# Patient Record
Sex: Female | Born: 1965 | Race: White | Hispanic: No | Marital: Married | Smoking: Current every day smoker
Health system: Southern US, Community
[De-identification: ages and names within clinical notes are randomized; demographics above are authoritative.]

## PROBLEM LIST (undated history)

## (undated) DIAGNOSIS — D649 Anemia, unspecified: Secondary | ICD-10-CM

## (undated) HISTORY — DX: Anemia, unspecified: D64.9

---

## 1989-05-03 HISTORY — PX: TUBAL LIGATION: SHX77

## 2002-10-18 ENCOUNTER — Encounter: Payer: Self-pay | Admitting: Emergency Medicine

## 2002-10-18 ENCOUNTER — Emergency Department (HOSPITAL_COMMUNITY): Admission: EM | Admit: 2002-10-18 | Discharge: 2002-10-18 | Payer: Self-pay | Admitting: Emergency Medicine

## 2008-02-03 ENCOUNTER — Emergency Department (HOSPITAL_COMMUNITY): Admission: EM | Admit: 2008-02-03 | Discharge: 2008-02-04 | Payer: Self-pay | Admitting: Emergency Medicine

## 2009-08-25 ENCOUNTER — Ambulatory Visit (HOSPITAL_COMMUNITY): Admission: RE | Admit: 2009-08-25 | Discharge: 2009-08-25 | Payer: Self-pay | Admitting: Family Medicine

## 2010-05-24 ENCOUNTER — Encounter: Payer: Self-pay | Admitting: Neurosurgery

## 2011-02-02 LAB — CBC
MCHC: 35.2
Platelets: 338
RBC: 4.13
WBC: 13.1 — ABNORMAL HIGH

## 2011-02-02 LAB — URINE MICROSCOPIC-ADD ON

## 2011-02-02 LAB — DIFFERENTIAL
Basophils Relative: 1
Lymphs Abs: 1.7
Monocytes Relative: 5
Neutro Abs: 10.7 — ABNORMAL HIGH
Neutrophils Relative %: 82 — ABNORMAL HIGH

## 2011-02-02 LAB — URINALYSIS, ROUTINE W REFLEX MICROSCOPIC
Bilirubin Urine: NEGATIVE
Glucose, UA: 250 — AB
Ketones, ur: 15 — AB
Specific Gravity, Urine: >1.03 — ABNORMAL HIGH
pH: 6.5

## 2016-11-24 ENCOUNTER — Other Ambulatory Visit (HOSPITAL_COMMUNITY)
Admission: RE | Admit: 2016-11-24 | Discharge: 2016-11-24 | Disposition: A | Discharge status: Home / Self Care | Payer: BLUE CROSS/BLUE SHIELD | Source: Ambulatory Visit | Attending: Obstetrics and Gynecology | Admitting: Obstetrics and Gynecology

## 2016-11-24 ENCOUNTER — Ambulatory Visit (INDEPENDENT_AMBULATORY_CARE_PROVIDER_SITE_OTHER): Payer: BLUE CROSS/BLUE SHIELD | Admitting: Obstetrics and Gynecology

## 2016-11-24 ENCOUNTER — Encounter: Payer: Self-pay | Admitting: Obstetrics and Gynecology

## 2016-11-24 VITALS — BP 90/52 | HR 83 | Ht 63.5 in | Wt 128.8 lb

## 2016-11-24 DIAGNOSIS — Z01419 Encounter for gynecological examination (general) (routine) without abnormal findings: Secondary | ICD-10-CM | POA: Diagnosis not present

## 2016-11-24 MED ORDER — HYDROCODONE-ACETAMINOPHEN 5-325 MG PO TABS: 1.0000 | ORAL_TABLET | Freq: Four times a day (QID) | ORAL | 0 refills | Status: DC | PRN

## 2016-11-24 NOTE — Progress Notes (Signed)
  Assessment:  Annual Gyn Exam   Plan:  1. pap smear done, next pap due 3 years 2. return annually or prn 3    Annual mammogram advised Subjective:  Catherine Jordan is a 51 y.o. female [id number] who presents for annual exam. No LMP recorded. Patient is not currently having periods (Reason: Perimenopausal). Pt has hx of cysts with presentation of RLQ pain and aches. Pt is sexually active with her husband. Her last visit to a GYN provider was over 12 years ago, with a pap and mammogram done then. She does not see a PCP regularly, but sees Dr. Gerda DissLuking for problem visits (cysts/kidney stones). Pt has occasional vaginal dryness that she treats with diaper rash cream.  The following portions of the patient's history were reviewed and updated as appropriate: allergies, current medications, past family history, past medical history, past social history, past surgical history and problem list. Past Medical History:  Diagnosis Date  . Anemia    during pregnancy    Past Surgical History:  Procedure Laterality Date  . TUBAL LIGATION  1991    No current outpatient prescriptions on file.  Review of Systems Constitutional: negative Gastrointestinal: negative Genitourinary: Negative  Objective:  BP (!) 90/52 (BP Location: Right Arm, Patient Position: Sitting, Cuff Size: Normal)   Pulse 83   Ht 5' 3.5" (1.613 m)   Wt 128 lb 12.8 oz (58.4 kg)   BMI 22.46 kg/m    BMI: Body mass index is 22.46 kg/m.  General Appearance: Alert, appropriate appearance for age. No acute distress HEENT: Grossly normal Neck / Thyroid:  Cardiovascular: RRR; normal S1, S2, no murmur Lungs: CTA bilaterally Back: No CVAT Breast Exam: No masses or nodes.No dimpling, nipple retraction or discharge. Gastrointestinal: Soft, non-tender, no masses or organomegaly Pelvic Exam:  External genitalia: normal general appearance Vaginal: normal mucosa without prolapse or lesions, normal secretions, bladder support Cervix: normal  appearance Adnexa: normal bimanual exam Uterus: normal single, non-tender, very tiny, Bladder support is good Pap done Rectovaginal: good support, guaiac negative stool obtained Lymphatic Exam: Non-palpable nodes in neck, clavicular, axillary, or inguinal regions  Skin: no rash or abnormalities, dark round spot on inner thigh Neurologic: Normal gait and speech, no tremor  Psychiatric: Alert and oriented, appropriate affect.  Urinalysis:Not done  Catherine Jordan. MD Pgr [phone] 12:11 PM    By signing my name below, I, Izna Ahmed, attest that this documentation has been prepared under the direction and in the presence of Tilda BurrowFerguson, Orlinda Slomski V, MD. Electronically Signed: Redge GainerIzna Ahmed, ED Scribe. 11/24/16. 12:12 PM.  I personally performed the services described in this documentation, which was SCRIBED in my presence. The recorded information has been reviewed and considered accurate. It has been edited as necessary during review. Tilda BurrowFERGUSON,Harlyn Italiano V, MD

## 2016-11-26 LAB — CYTOLOGY - PAP
Diagnosis: NEGATIVE
HPV: NOT DETECTED

## 2016-12-06 ENCOUNTER — Other Ambulatory Visit: Payer: Self-pay | Admitting: *Deleted

## 2019-02-21 ENCOUNTER — Other Ambulatory Visit (HOSPITAL_COMMUNITY): Payer: Self-pay | Admitting: Internal Medicine

## 2019-02-21 DIAGNOSIS — Z1231 Encounter for screening mammogram for malignant neoplasm of breast: Secondary | ICD-10-CM

## 2019-03-19 ENCOUNTER — Other Ambulatory Visit: Payer: Self-pay

## 2019-03-19 ENCOUNTER — Ambulatory Visit (HOSPITAL_COMMUNITY)
Admission: RE | Admit: 2019-03-19 | Discharge: 2019-03-19 | Disposition: A | Discharge status: Home / Self Care | Payer: BC Managed Care – PPO | Source: Ambulatory Visit | Attending: Internal Medicine | Admitting: Internal Medicine

## 2019-03-19 DIAGNOSIS — Z1231 Encounter for screening mammogram for malignant neoplasm of breast: Secondary | ICD-10-CM | POA: Diagnosis not present

## 2019-03-20 ENCOUNTER — Other Ambulatory Visit (HOSPITAL_COMMUNITY): Payer: Self-pay | Admitting: Internal Medicine

## 2019-03-20 DIAGNOSIS — R928 Other abnormal and inconclusive findings on diagnostic imaging of breast: Secondary | ICD-10-CM

## 2019-03-27 ENCOUNTER — Ambulatory Visit (HOSPITAL_COMMUNITY): Payer: BC Managed Care – PPO

## 2019-03-27 ENCOUNTER — Encounter (HOSPITAL_COMMUNITY): Payer: BC Managed Care – PPO

## 2019-04-10 ENCOUNTER — Ambulatory Visit (HOSPITAL_COMMUNITY)
Admission: RE | Admit: 2019-04-10 | Discharge: 2019-04-10 | Disposition: A | Discharge status: Home / Self Care | Payer: BC Managed Care – PPO | Source: Ambulatory Visit | Attending: Internal Medicine | Admitting: Internal Medicine

## 2019-04-10 ENCOUNTER — Other Ambulatory Visit: Payer: Self-pay

## 2019-04-10 ENCOUNTER — Other Ambulatory Visit (HOSPITAL_COMMUNITY): Payer: BC Managed Care – PPO

## 2019-04-10 DIAGNOSIS — R928 Other abnormal and inconclusive findings on diagnostic imaging of breast: Secondary | ICD-10-CM

## 2019-11-13 ENCOUNTER — Other Ambulatory Visit (HOSPITAL_COMMUNITY): Payer: Self-pay | Admitting: Internal Medicine

## 2019-11-13 ENCOUNTER — Ambulatory Visit (HOSPITAL_COMMUNITY)
Admission: RE | Admit: 2019-11-13 | Discharge: 2019-11-13 | Disposition: A | Discharge status: Home / Self Care | Payer: 59 | Source: Ambulatory Visit | Attending: Internal Medicine | Admitting: Internal Medicine

## 2019-11-13 ENCOUNTER — Other Ambulatory Visit: Payer: Self-pay

## 2019-11-13 DIAGNOSIS — M79671 Pain in right foot: Secondary | ICD-10-CM

## 2020-01-23 ENCOUNTER — Ambulatory Visit: Payer: 59 | Admitting: Podiatry

## 2020-01-24 ENCOUNTER — Ambulatory Visit: Payer: 59

## 2020-01-24 ENCOUNTER — Encounter: Payer: Self-pay | Admitting: Podiatry

## 2020-01-24 ENCOUNTER — Other Ambulatory Visit: Payer: Self-pay

## 2020-01-24 ENCOUNTER — Ambulatory Visit (INDEPENDENT_AMBULATORY_CARE_PROVIDER_SITE_OTHER): Payer: 59 | Admitting: Podiatry

## 2020-01-24 DIAGNOSIS — M12571 Traumatic arthropathy, right ankle and foot: Secondary | ICD-10-CM | POA: Diagnosis not present

## 2020-01-24 DIAGNOSIS — S9031XA Contusion of right foot, initial encounter: Secondary | ICD-10-CM

## 2020-01-24 DIAGNOSIS — M778 Other enthesopathies, not elsewhere classified: Secondary | ICD-10-CM

## 2020-01-24 NOTE — Progress Notes (Signed)
  Subjective:  Patient ID: Catherine Jordan, female    DOB: Jan 06, 1966,  MRN: [id number] HPI Chief Complaint  Patient presents with  . Foot Pain    1st interspace/MPJ right - injury several months ago, got some better but then started to burn a lotat least for 4-5 months, redness, PCP xrayed-said had some arthritis and a bunion and gave prednisone  . New Patient (Initial Visit)    54 y.o. female presents with the above complaint.   ROS: Denies fever chills nausea vomiting muscle aches pains calf pain back pain chest pain shortness of breath.  Past Medical History:  Diagnosis Date  . Anemia    during pregnancy   Past Surgical History:  Procedure Laterality Date  . TUBAL LIGATION  1991   No current outpatient medications on file.  No Known Allergies Review of Systems Objective:  There were no vitals filed for this visit.  General: Well developed, nourished, in no acute distress, alert and oriented x3   Dermatological: Skin is warm, dry and supple bilateral. Nails x 10 are well maintained; remaining integument appears unremarkable at this time. There are no open sores, no preulcerative lesions, no rash or signs of infection present.  Vascular: Dorsalis Pedis artery and Posterior Tibial artery pedal pulses are 2/4 bilateral with immedate capillary fill time. Pedal hair growth present. No varicosities and no lower extremity edema present bilateral.   Neruologic: Grossly intact via light touch bilateral. Vibratory intact via tuning fork bilateral. Protective threshold with Semmes Wienstein monofilament intact to all pedal sites bilateral. Patellar and Achilles deep tendon reflexes 2+ bilateral. No Babinski or clonus noted bilateral.   Musculoskeletal: No gross boney pedal deformities bilateral. No pain, crepitus, or limitation noted with foot and ankle range of motion bilateral. Muscular strength 5/5 in all groups tested bilateral.  She has severe pain on palpation and end range of motion  of the first metatarsophalangeal joint crepitation.  Increase in the first metatarsal angle consistent with hypertrophic medial condyle and pain on palpation of this condyle right foot.  Gait: Unassisted, Nonantalgic.    Radiographs:  Radiographs reviewed today from PCP demonstrated a joint space contracture small dorsal lipping essentially osteoarthritis of the first metatarsophalangeal joint.  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  She also has tenderness on the sesamoids with sesamoiditis.  Plan: Requesting an MRI of this right foot conservative therapies provided by the PCP had failed consisting of immobilization Voltaren gel and prednisone.  At this point I feel secondary to a history of an injury with this prior to surgery an MRI is indicated.     Gurpreet Mariani T. Commodore, [place]

## 2020-07-08 ENCOUNTER — Other Ambulatory Visit (HOSPITAL_COMMUNITY): Payer: Self-pay | Admitting: Internal Medicine

## 2020-07-08 DIAGNOSIS — Z1231 Encounter for screening mammogram for malignant neoplasm of breast: Secondary | ICD-10-CM

## 2020-07-14 ENCOUNTER — Other Ambulatory Visit: Payer: Self-pay

## 2020-07-14 ENCOUNTER — Ambulatory Visit (HOSPITAL_COMMUNITY)
Admission: RE | Admit: 2020-07-14 | Discharge: 2020-07-14 | Disposition: A | Discharge status: Home / Self Care | Payer: 59 | Source: Ambulatory Visit | Attending: Internal Medicine | Admitting: Internal Medicine

## 2020-07-14 DIAGNOSIS — Z1231 Encounter for screening mammogram for malignant neoplasm of breast: Secondary | ICD-10-CM | POA: Insufficient documentation

## 2020-08-12 ENCOUNTER — Encounter: Payer: Self-pay | Admitting: Adult Health

## 2020-08-12 ENCOUNTER — Other Ambulatory Visit (HOSPITAL_COMMUNITY)
Admission: RE | Admit: 2020-08-12 | Discharge: 2020-08-12 | Disposition: A | Discharge status: Home / Self Care | Payer: 59 | Source: Ambulatory Visit | Attending: Adult Health | Admitting: Adult Health

## 2020-08-12 ENCOUNTER — Ambulatory Visit (INDEPENDENT_AMBULATORY_CARE_PROVIDER_SITE_OTHER): Payer: 59 | Admitting: Adult Health

## 2020-08-12 ENCOUNTER — Other Ambulatory Visit: Payer: Self-pay

## 2020-08-12 VITALS — BP 107/62 | HR 90 | Ht 62.0 in | Wt 109.5 lb

## 2020-08-12 DIAGNOSIS — Z1211 Encounter for screening for malignant neoplasm of colon: Secondary | ICD-10-CM | POA: Diagnosis not present

## 2020-08-12 DIAGNOSIS — Z01419 Encounter for gynecological examination (general) (routine) without abnormal findings: Secondary | ICD-10-CM | POA: Insufficient documentation

## 2020-08-12 LAB — HEMOCCULT GUIAC POC 1CARD (OFFICE): Fecal Occult Blood, POC: NEGATIVE

## 2020-08-12 NOTE — Progress Notes (Signed)
Patient ID: GENNETTE SHADIX, female   DOB: Sep 05, 1965, 55 y.o.   MRN: [id number] History of Present Illness:  Faige is a 54 year old white female, married, PM in for a well woman gyn exam and pap. PCP is Dr Ouida Sills.  Current Medications, Allergies, Past Medical History, Past Surgical History, Family History and Social History were reviewed in [employer] record.     Review of Systems: Patient denies any headaches, hearing loss, fatigue, blurred vision, shortness of breath, chest pain, abdominal pain, problems with bowel movements, urination, or intercourse(not active), has decreased libido. No joint pain or mood swings. Denies any vaginal bleeding Has bunion right feet, not hurting now, has seen [facility], has OA.    Physical Exam:BP 107/62 (BP Location: Left Arm, Patient Position: Sitting, Cuff Size: Normal)   Pulse 90   Ht 5\' 2"  (1.575 m)   Wt 109 lb 8 oz (49.7 kg)   BMI 20.03 kg/m  General:  Well developed, well nourished, no acute distress Skin:  Warm and dry Neck:  Midline trachea, normal thyroid, good ROM, no lymphadenopathy Lungs; Clear to auscultation bilaterally Breast:  No dominant palpable mass, retraction, or nipple discharge Cardiovascular: Regular rate and rhythm Abdomen:  Soft, non tender, no hepatosplenomegaly Pelvic:  External genitalia is normal in appearance, no lesions.  The vagina is pale with loss of moisture and rugae. Urethra has no lesions or masses. The cervix is smooth with stenotic os, pap with HR HPV genotyping performed.  Uterus is felt to be normal size, shape, and contour.  No adnexal masses or tenderness noted.Bladder is non tender, no masses felt. Rectal: Good sphincter tone, no polyps, or hemorrhoids felt.  Hemoccult negative. Extremities/musculoskeletal:  No swelling or varicosities noted, no clubbing or cyanosis Psych:  No mood changes, alert and cooperative,seems happy AA is 2 Fall risk is low PHQ 9 score is  4,dad died recently GAD 7 score is 0  Upstream - 08/12/20 1122      Pregnancy Intention Screening   Does the patient want to become pregnant in the next year? No    Does the patient's partner want to become pregnant in the next year? No    Would the patient like to discuss contraceptive options today? No      Contraception Wrap Up   Current Method Female Sterilization    End Method Female Sterilization    Contraception Counseling Provided No         Examination chaperoned by 10/12/20 LPN   Impression and Plan: 1. Encounter for gynecological examination with Papanicolaou smear of cervix Pap sent Physical in 1 year Pap in 3 if normal Labs with PCP Mammogram yearly Colonoscopy or Cologuard advised She declines COVID vaccines   2. Encounter for screening fecal occult blood testing

## 2020-08-18 ENCOUNTER — Encounter: Payer: Self-pay | Admitting: Adult Health

## 2020-08-18 ENCOUNTER — Telehealth: Payer: Self-pay | Admitting: Adult Health

## 2020-08-18 DIAGNOSIS — R87810 Cervical high risk human papillomavirus (HPV) DNA test positive: Secondary | ICD-10-CM | POA: Insufficient documentation

## 2020-08-18 DIAGNOSIS — R87610 Atypical squamous cells of undetermined significance on cytologic smear of cervix (ASC-US): Secondary | ICD-10-CM

## 2020-08-18 HISTORY — DX: Atypical squamous cells of undetermined significance on cytologic smear of cervix (ASC-US): R87.610

## 2020-08-18 LAB — CYTOLOGY - PAP
Comment: NEGATIVE
Comment: NEGATIVE
Diagnosis: UNDETERMINED — AB
HPV 16: NEGATIVE
HPV 18 / 45: NEGATIVE
High risk HPV: POSITIVE — AB

## 2020-08-18 NOTE — Telephone Encounter (Signed)
Pt aware of pap results and need to repeat in 1 year

## 2020-12-09 IMAGING — MG MM DIGITAL DIAGNOSTIC UNILAT*L* W/ TOMO W/ CAD
4 series · 4 of 12 positions shown · non-contrast
Comparison: Previous exam(s).

CLINICAL DATA: Screening recall for left breast mass seen on the
MLO view only.

EXAM:
DIGITAL DIAGNOSTIC UNILATERAL LEFT MAMMOGRAM WITH CAD AND TOMO
LEFT BREAST ULTRASOUND

[L MLO synth-2D]
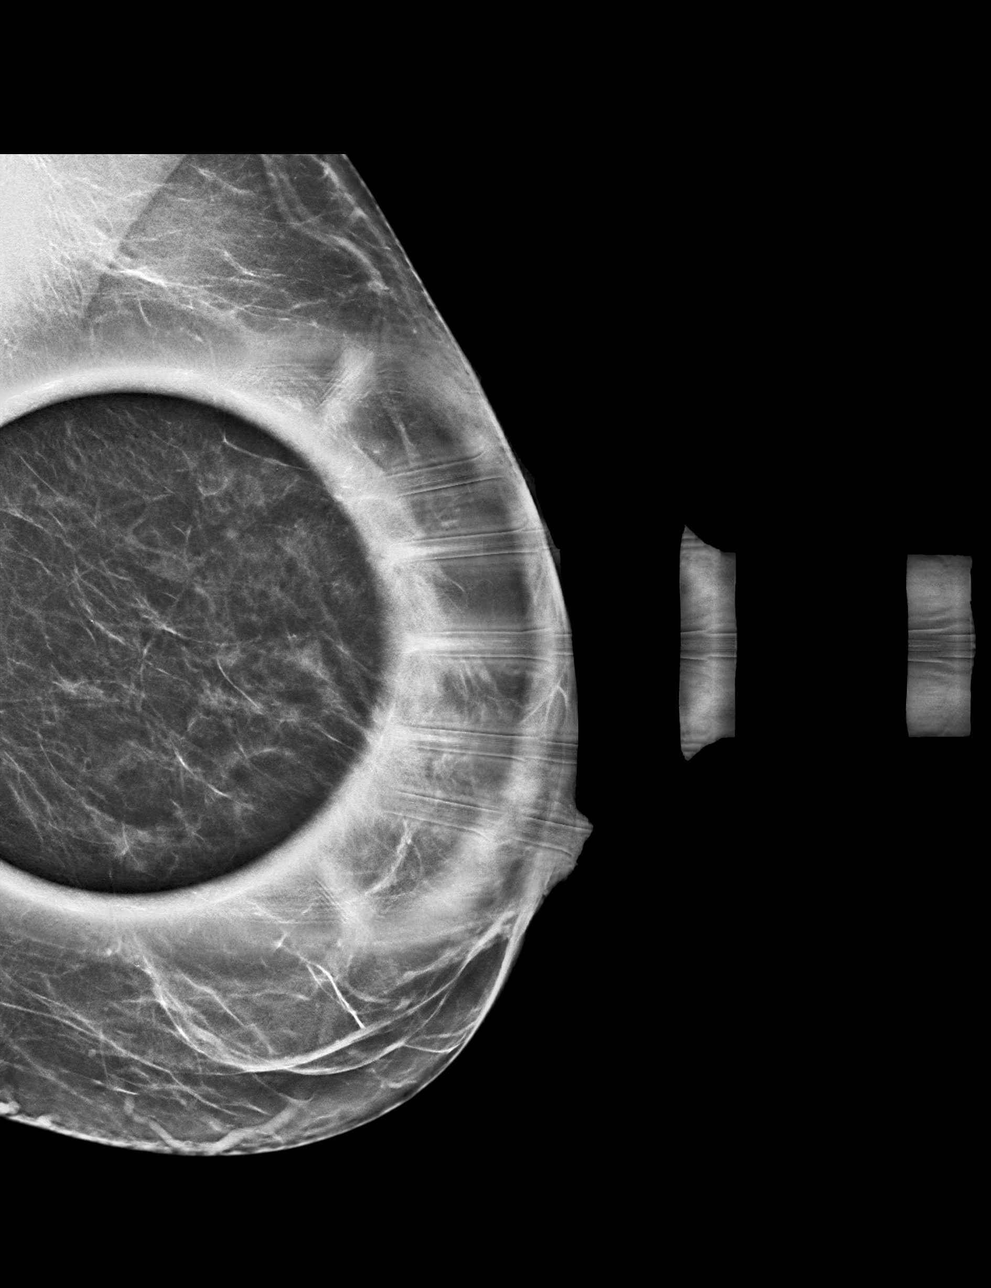

[L ML synth-2D]
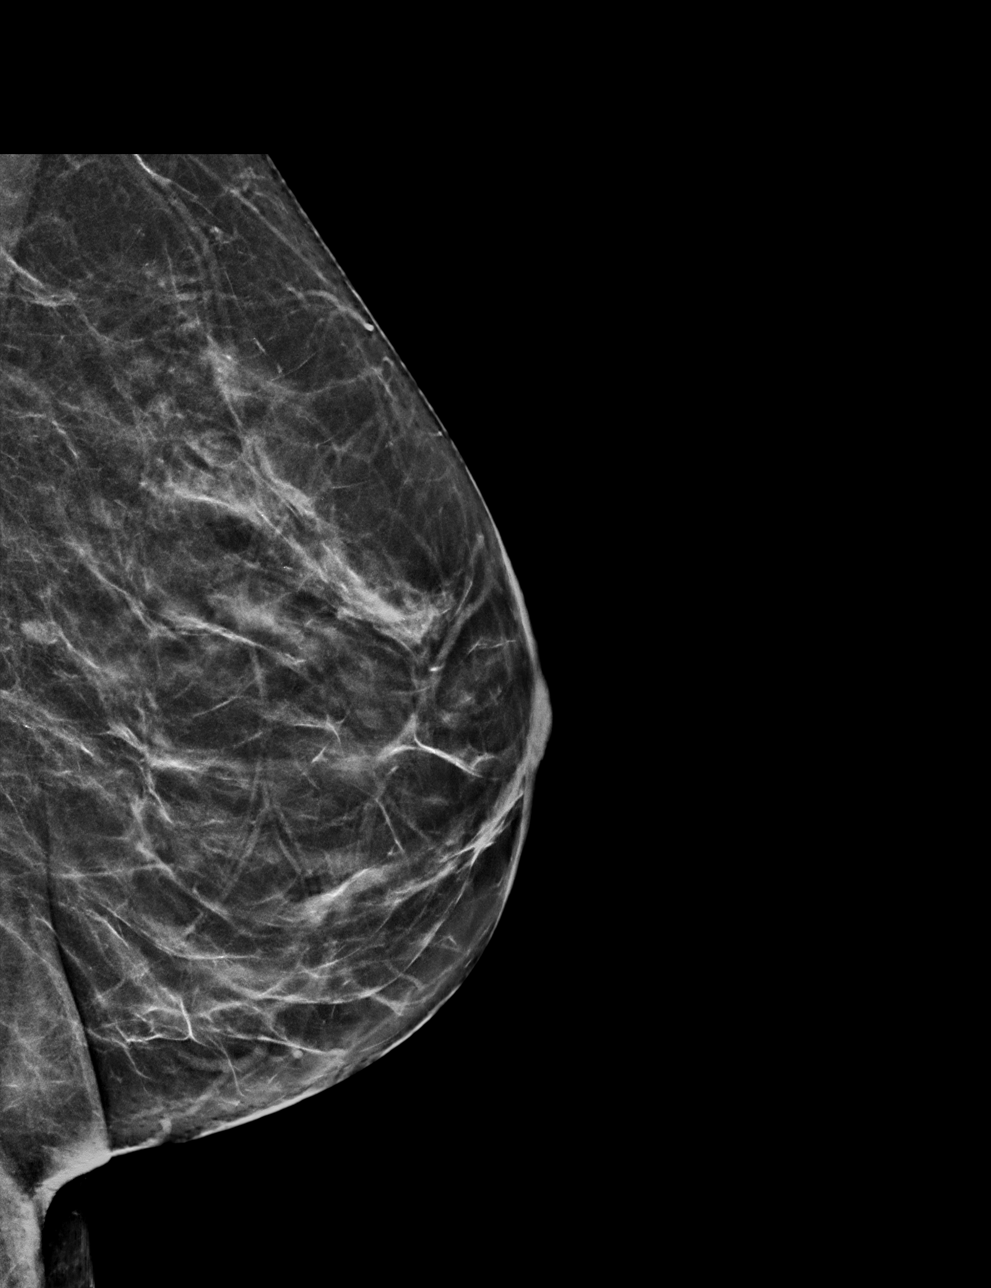

[L ML tomo · tomo slice 30/59.0]
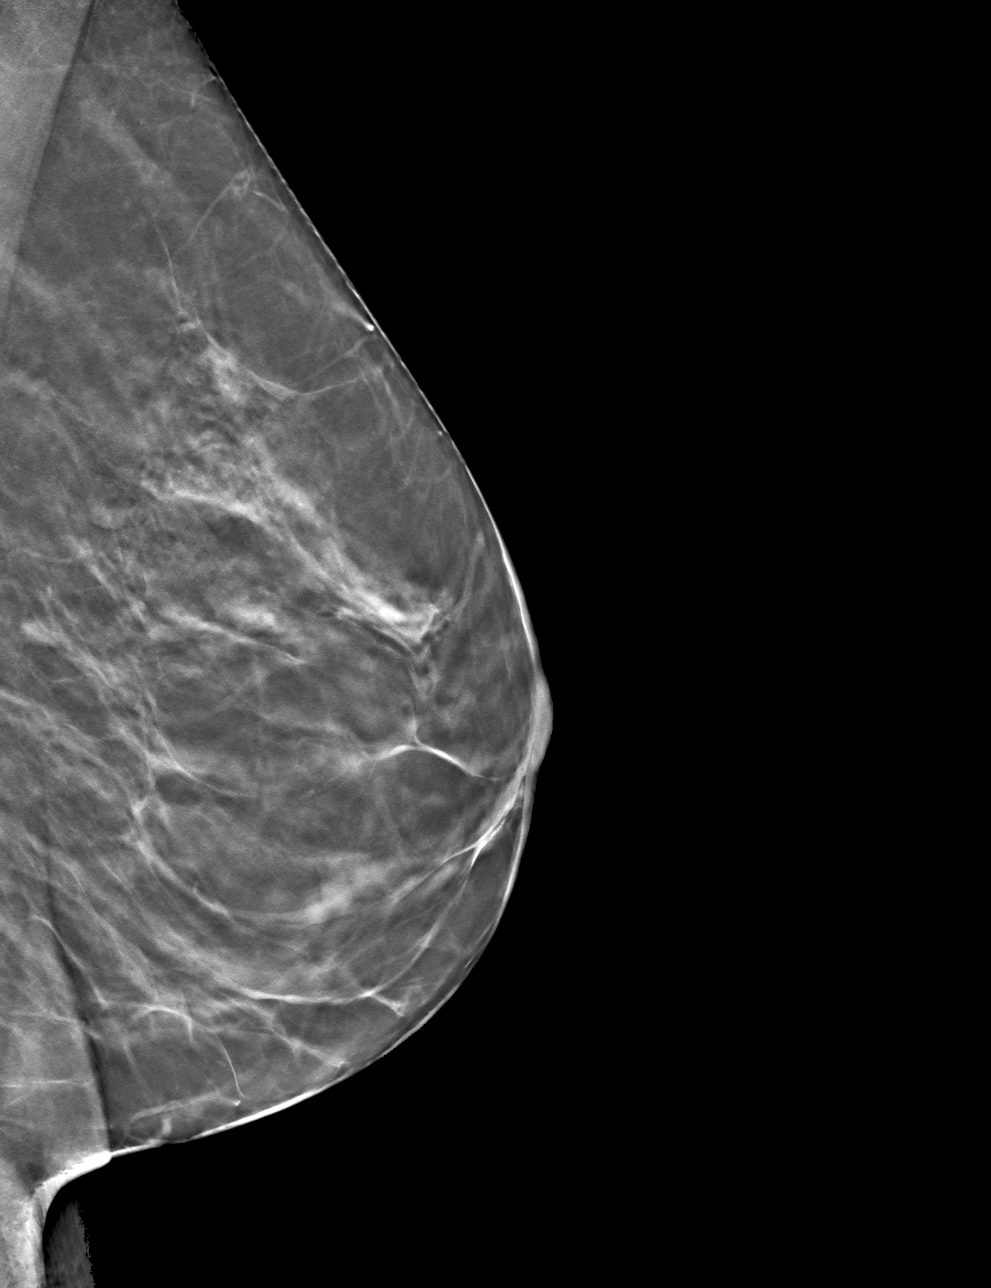

[L MLO tomo · tomo slice 27/54.0]
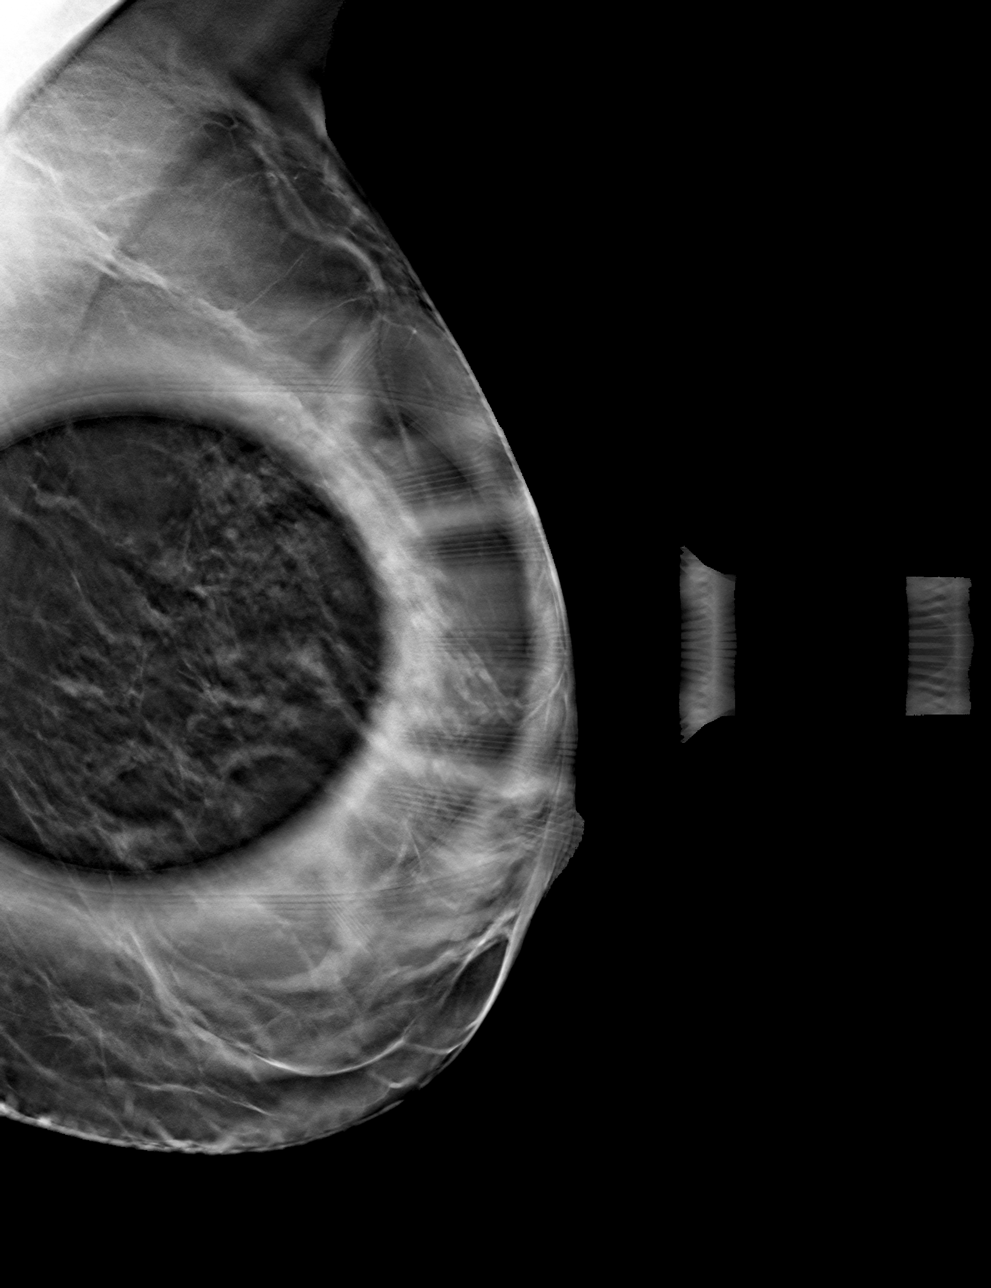

[4 of 12 positions shown; findings below may reference images not displayed]

ACR Breast Density Category c: The breast tissue is heterogeneously
dense, which may obscure small masses.
FINDINGS: Additional tomograms were performed of the left breast. There is an
oval well-circumscribed mass in the outer posterior left breast
measuring approximately 0.7 cm.

Mammographic images were processed with CAD.

Targeted ultrasound of the outer left breast was performed. There is
a cluster of microcysts at 3 o'clock 3 cm from nipple measuring
x 0.3 x 0.6 cm. This corresponds well with the mass seen in the
outer left breast at mammography.
IMPRESSION: No findings of malignancy in the left breast.

RECOMMENDATION:
Screening mammogram in one year.(Code:NB-C-NWE)

I have discussed the findings and recommendations with the patient.
If applicable, a reminder letter will be sent to the patient
regarding the next appointment.

BI-RADS CATEGORY  2: Benign.

## 2020-12-09 IMAGING — US US BREAST*L* LIMITED INC AXILLA
1 series · 11 of 11 positions shown · non-contrast
Comparison: Previous exam(s).

CLINICAL DATA: Screening recall for left breast mass seen on the
MLO view only.

EXAM:
DIGITAL DIAGNOSTIC UNILATERAL LEFT MAMMOGRAM WITH CAD AND TOMO
LEFT BREAST ULTRASOUND

[Series 1: us breast*left* limited inc axilla · 0.07mm/px · 11 of 11 slices shown]
[im 1/11]
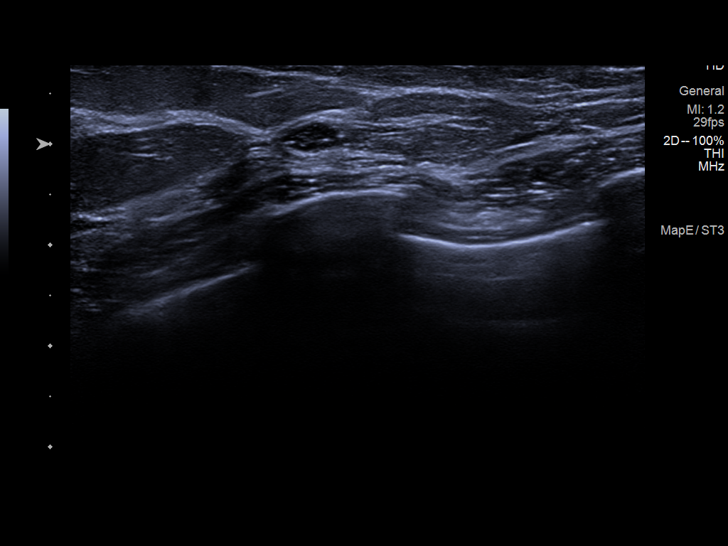
[im 2/11]
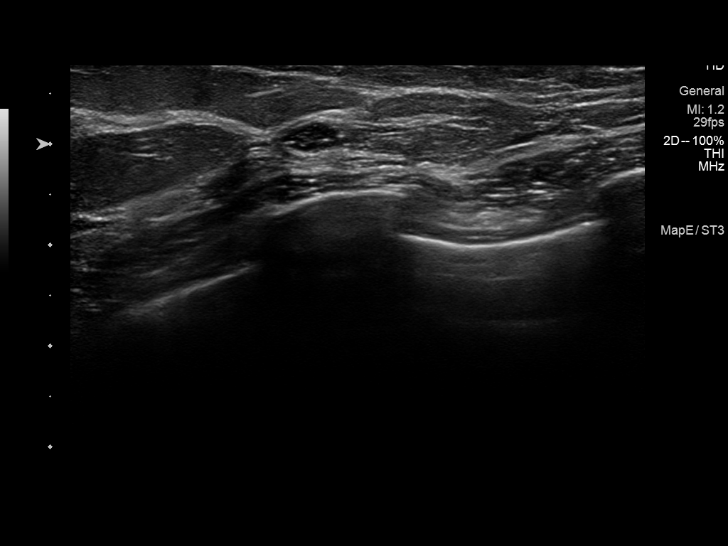
[im 3/11]
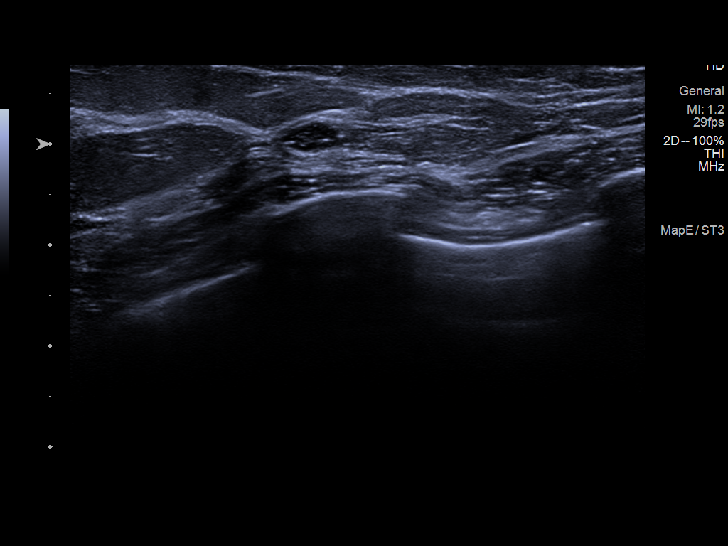
[im 4/11]
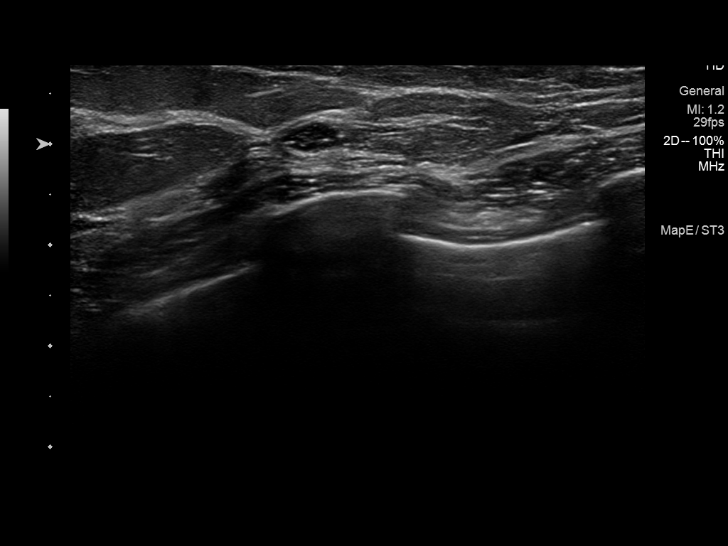
[im 5/11]
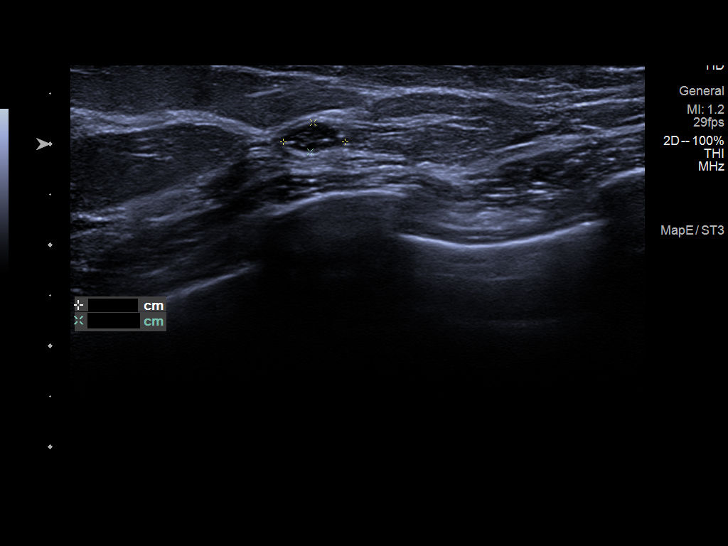
[im 6/11]
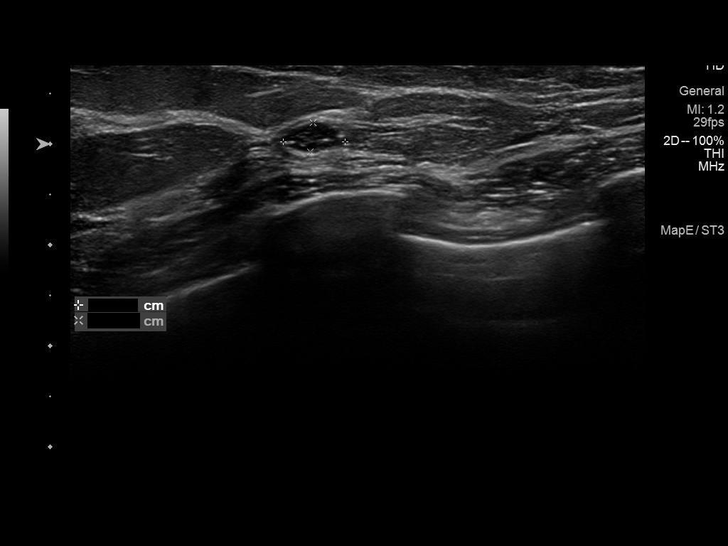
[im 7/11]
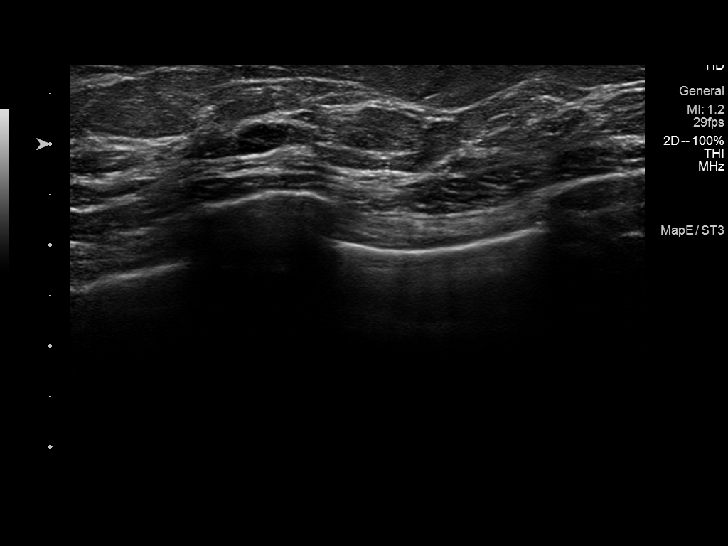
[im 8/11]
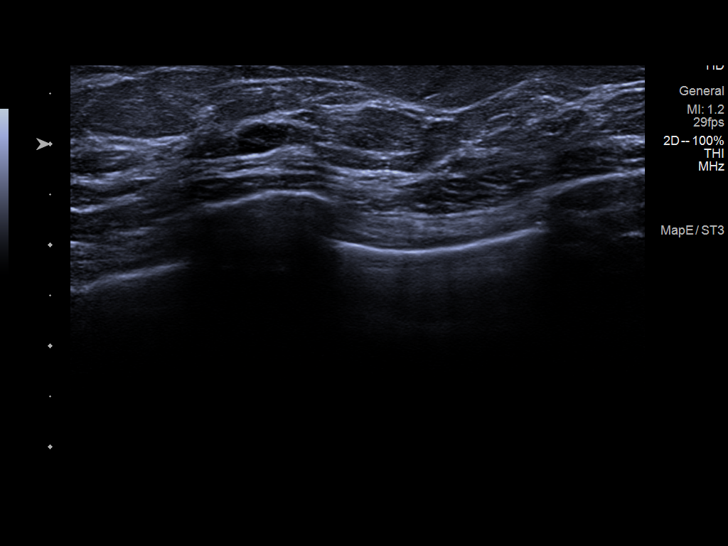
[im 9/11]
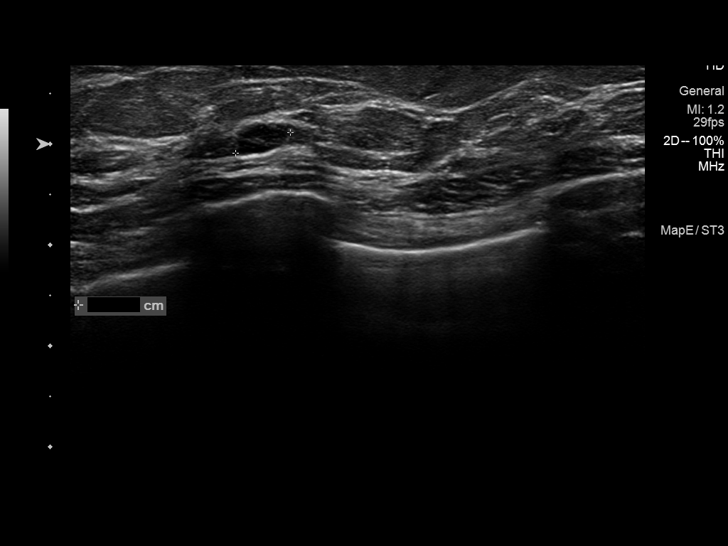
[im 10/11]
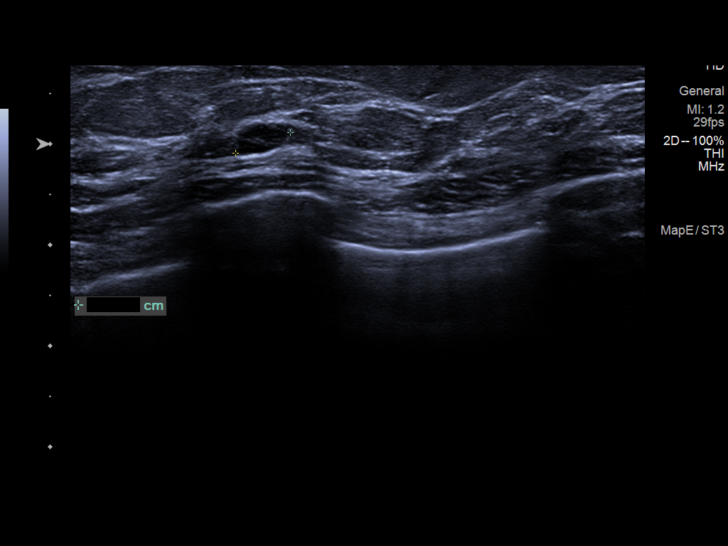
[im 11/11]
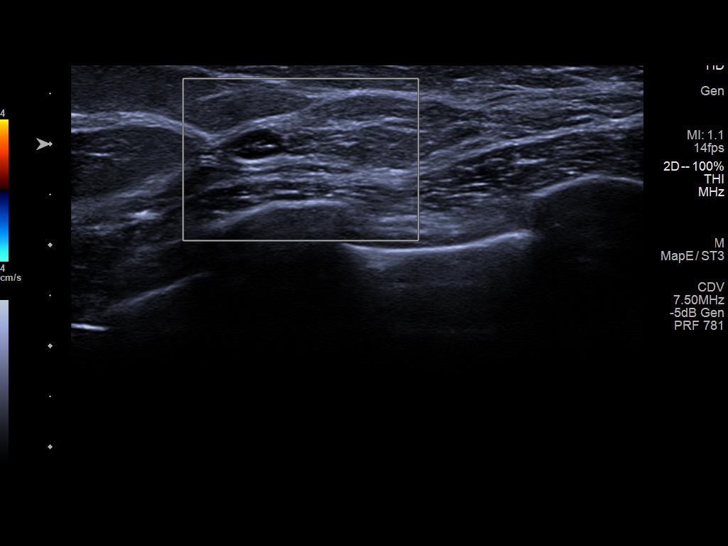

[11 of 11 positions shown; findings below may reference images not displayed]

ACR Breast Density Category c: The breast tissue is heterogeneously
dense, which may obscure small masses.
FINDINGS: Additional tomograms were performed of the left breast. There is an
oval well-circumscribed mass in the outer posterior left breast
measuring approximately 0.7 cm.

Mammographic images were processed with CAD.

Targeted ultrasound of the outer left breast was performed. There is
a cluster of microcysts at 3 o'clock 3 cm from nipple measuring
x 0.3 x 0.6 cm. This corresponds well with the mass seen in the
outer left breast at mammography.
IMPRESSION: No findings of malignancy in the left breast.

RECOMMENDATION:
Screening mammogram in one year.(Code:NB-C-NWE)

I have discussed the findings and recommendations with the patient.
If applicable, a reminder letter will be sent to the patient
regarding the next appointment.

BI-RADS CATEGORY  2: Benign.

## 2021-07-13 ENCOUNTER — Other Ambulatory Visit (HOSPITAL_COMMUNITY): Payer: Self-pay | Admitting: Internal Medicine

## 2021-07-13 DIAGNOSIS — Z1231 Encounter for screening mammogram for malignant neoplasm of breast: Secondary | ICD-10-CM

## 2021-07-22 ENCOUNTER — Other Ambulatory Visit: Payer: Self-pay

## 2021-07-22 ENCOUNTER — Ambulatory Visit (HOSPITAL_COMMUNITY)
Admission: RE | Admit: 2021-07-22 | Discharge: 2021-07-22 | Disposition: A | Discharge status: Home / Self Care | Payer: 59 | Source: Ambulatory Visit | Attending: Internal Medicine | Admitting: Internal Medicine

## 2021-07-22 DIAGNOSIS — Z1231 Encounter for screening mammogram for malignant neoplasm of breast: Secondary | ICD-10-CM | POA: Diagnosis present

## 2021-08-17 ENCOUNTER — Encounter: Payer: Self-pay | Admitting: Obstetrics & Gynecology

## 2021-08-17 ENCOUNTER — Other Ambulatory Visit (HOSPITAL_COMMUNITY)
Admission: RE | Admit: 2021-08-17 | Discharge: 2021-08-17 | Disposition: A | Discharge status: Home / Self Care | Payer: Commercial Managed Care - PPO | Source: Ambulatory Visit | Attending: Obstetrics & Gynecology | Admitting: Obstetrics & Gynecology

## 2021-08-17 ENCOUNTER — Ambulatory Visit (INDEPENDENT_AMBULATORY_CARE_PROVIDER_SITE_OTHER): Payer: Commercial Managed Care - PPO | Admitting: Obstetrics & Gynecology

## 2021-08-17 VITALS — BP 110/66 | HR 87 | Ht 63.0 in | Wt 114.2 lb

## 2021-08-17 DIAGNOSIS — Z01419 Encounter for gynecological examination (general) (routine) without abnormal findings: Secondary | ICD-10-CM | POA: Insufficient documentation

## 2021-08-17 DIAGNOSIS — R6882 Decreased libido: Secondary | ICD-10-CM | POA: Diagnosis not present

## 2021-08-17 DIAGNOSIS — R87810 Cervical high risk human papillomavirus (HPV) DNA test positive: Secondary | ICD-10-CM

## 2021-08-17 DIAGNOSIS — R87610 Atypical squamous cells of undetermined significance on cytologic smear of cervix (ASC-US): Secondary | ICD-10-CM

## 2021-08-17 MED ORDER — TESTOSTERONE 12.5 MG/ACT (1%) TD GEL: 0.5000 g | Freq: Every day | TRANSDERMAL | 3 refills | Status: DC

## 2021-08-17 NOTE — Progress Notes (Signed)
? ?WELL-WOMAN EXAMINATION ?Patient name: Catherine Jordan MRN [id number]  Date of birth: 10-23-1965 ?Chief Complaint:   ?Gynecologic Exam and Annual Exam ? ?History of Present Illness:   ?Catherine Jordan is a 56 y.o. [phone]  female being seen today for a routine well-woman exam.  ? ?Decreased libido- ongoing issue.  Notes she has absolutely no interest in intercourse.  Denies dryness or discomfort, just completely not interested ? ?No LMP recorded. Patient is postmenopausal. ?Denies any vaginal bleeding, discharge, itching or discomfort. ? ?Last pap 08/2020- ASCUS, HPV+.  ?Last mammogram: 07/2021. ?Last colonoscopy: knows to schedule ? ? ?  08/17/2021  ?  2:20 PM 08/12/2020  ? 11:23 AM 11/24/2016  ? 11:41 AM  ?Depression screen PHQ 2/9  ?Decreased Interest 0 0 0  ?Down, Depressed, Hopeless 0 0 0  ?PHQ - 2 Score 0 0 0  ?Altered sleeping 0 0   ?Tired, decreased energy 2 2   ?Change in appetite 0 2   ?Feeling bad or failure about yourself  0 0   ?Trouble concentrating 0 0   ?Moving slowly or fidgety/restless 0 0   ?Suicidal thoughts 0 0   ?PHQ-9 Score 2 4   ? ? ? ? ?Review of Systems:   ?Pertinent items are noted in HPI ?Denies any headaches, blurred vision, fatigue, shortness of breath, chest pain, abdominal pain, bowel movements, urination unless otherwise stated above. ? ?Pertinent History Reviewed:  ?Reviewed past medical,surgical, social and family history.  ?Reviewed problem list, medications and allergies. ?Physical Assessment:  ? ?Vitals:  ? 08/17/21 1422  ?BP: 110/66  ?Pulse: 87  ?Weight: 114 lb 3.2 oz (51.8 kg)  ?Height: 5\' 3"  (1.6 m)  ?Body mass index is 20.23 kg/m?. ?  ?     Physical Examination:  ? General appearance - well appearing, and in no distress ? Mental status - alert, oriented to person, place, and time ? Psych:  She has a normal mood and affect ? Skin - warm and dry, normal color, no suspicious lesions noted ? Chest - effort normal, all lung fields clear to auscultation bilaterally ? Heart - normal rate  and regular rhythm ? Neck:  midline trachea, no thyromegaly or nodules ? Breasts - breasts appear normal, no suspicious masses, no skin or nipple changes or  axillary nodes ? Abdomen - soft, nontender, nondistended, no masses or organomegaly ? Pelvic - VULVA: normal appearing vulva with no masses, tenderness or lesions  VAGINA: normal appearing vagina with normal color and discharge, no lesions  CERVIX: normal appearing cervix without discharge or lesions, no CMT ? Thin prep pap is done with HR HPV cotesting ? UTERUS: uterus is felt to be normal size, shape, consistency and nontender  ? ADNEXA: No adnexal masses or tenderness noted. ? Extremities:  No swelling or varicosities noted ? ?Chaperone: Clint Bolder   ? ? ?Assessment & Plan:  ?1) Well-Woman Exam ?Pap collected ue to prior ASCUS, HPV+ ?Mammogram up to date ?Pt plans to schedule colonoscopy ? ?2) Decreased libido ?-discussed Addyi and indication for perimenopausal female ?-for postmenopausal best option- testosterone gel- reviewed risk/benefit ?Pt reports LFT testing with PCP- will review yearly ?-encouraged trial for at least 2-3 mos ?-f/u with any acute concerns ? ?Meds ordered this encounter  ?Medications  ? Testosterone 12.5 MG/ACT (1%) GEL  ?  Sig: Place 0.5 g onto the skin daily.  ?  Dispense:  60 g  ?  Refill:  3  ? ? ?Follow-up: Return in about  1 year (around 08/18/2022) for Annual. ? ? ?Janyth Pupa, DO ?Attending [place], Faculty Practice ?[facility] ? ? ?

## 2021-08-20 LAB — CYTOLOGY - PAP
Comment: NEGATIVE
Comment: NEGATIVE
Comment: NEGATIVE
Diagnosis: UNDETERMINED — AB
HPV 16: NEGATIVE
HPV 18 / 45: NEGATIVE
High risk HPV: POSITIVE — AB

## 2021-08-21 ENCOUNTER — Telehealth: Payer: Self-pay | Admitting: *Deleted

## 2021-08-21 NOTE — Telephone Encounter (Signed)
-----   Message from Myna Hidalgo, DO sent at 08/21/2021  5:00 AM EDT ----- ?ASCUS, HPV pos, please schedule for colposcopy ?----- Message ----- ?From: Interface, [facility] ?Sent: 08/20/2021   7:30 PM EDT ?To: Myna Hidalgo, DO ? ? ?

## 2021-08-21 NOTE — Telephone Encounter (Signed)
Pt aware of her abnormal pap and that she needs a colpo. Pt voiced understanding and call was transferred to [facility] for appt. JSY ?

## 2021-09-01 ENCOUNTER — Encounter: Payer: Self-pay | Admitting: Obstetrics & Gynecology

## 2021-09-01 ENCOUNTER — Ambulatory Visit (INDEPENDENT_AMBULATORY_CARE_PROVIDER_SITE_OTHER): Payer: Commercial Managed Care - PPO | Admitting: Obstetrics & Gynecology

## 2021-09-01 VITALS — BP 107/64 | HR 93 | Ht 63.0 in | Wt 113.0 lb

## 2021-09-01 DIAGNOSIS — R87610 Atypical squamous cells of undetermined significance on cytologic smear of cervix (ASC-US): Secondary | ICD-10-CM

## 2021-09-01 DIAGNOSIS — R87810 Cervical high risk human papillomavirus (HPV) DNA test positive: Secondary | ICD-10-CM | POA: Diagnosis not present

## 2021-09-01 NOTE — Progress Notes (Signed)
? ? ?  Colposcopy Procedure Note: ? ? ? ?Colposcopy Procedure Note ? ?Indications:  ?2023 ASCUS +HR HPV/neg 16+18 ?2022 ASCUS + HR HPV/neg 16+18 ?2018 normal cytology ? ? ? ?2019 ASCCP recommendation: ? ?Smoker:  Yes.   ?New sexual partner:  No.  ? ?History of abnormal Pap: yes ? ?Procedure Details  ?The risks and benefits of the procedure and Written informed consent obtained. ? ?Speculum placed in vagina and excellent visualization of cervix achieved, cervix swabbed x 3 with acetic acid solution. ? ?Findings: ?Adequate colposcopy is noted today. ? ?Cervix: no visible lesions, no mosaicism, no punctation, and no abnormal vasculature; SCJ visualized 360 degrees without lesions and no biopsies taken. ?Vaginal inspection: normal without visible lesions. ?Vulvar colposcopy: vulvar colposcopy not performed. ? ?Specimens: none ? ?Complications: none. ? ?Colposcopic Impression: ?Normal colposcopy, atrophic changes ? ?Plan(Based on 2019 ASCCP recommendations) ?Follow up HPV based cytology ? ? ?

## 2022-01-25 ENCOUNTER — Other Ambulatory Visit (HOSPITAL_COMMUNITY): Payer: Self-pay | Admitting: Internal Medicine

## 2022-01-25 ENCOUNTER — Ambulatory Visit (HOSPITAL_COMMUNITY)
Admission: RE | Admit: 2022-01-25 | Discharge: 2022-01-25 | Disposition: A | Discharge status: Home / Self Care | Payer: Commercial Managed Care - PPO | Source: Ambulatory Visit | Attending: Internal Medicine | Admitting: Internal Medicine

## 2022-01-25 DIAGNOSIS — M79661 Pain in right lower leg: Secondary | ICD-10-CM | POA: Diagnosis not present

## 2022-01-25 DIAGNOSIS — M79671 Pain in right foot: Secondary | ICD-10-CM | POA: Diagnosis present

## 2022-03-01 ENCOUNTER — Encounter (INDEPENDENT_AMBULATORY_CARE_PROVIDER_SITE_OTHER): Payer: Self-pay | Admitting: *Deleted

## 2022-07-06 ENCOUNTER — Other Ambulatory Visit (HOSPITAL_COMMUNITY): Payer: Self-pay | Admitting: Internal Medicine

## 2022-07-06 DIAGNOSIS — Z1231 Encounter for screening mammogram for malignant neoplasm of breast: Secondary | ICD-10-CM

## 2022-08-02 ENCOUNTER — Encounter (INDEPENDENT_AMBULATORY_CARE_PROVIDER_SITE_OTHER): Payer: Self-pay | Admitting: *Deleted

## 2022-08-02 ENCOUNTER — Ambulatory Visit (HOSPITAL_COMMUNITY)
Admission: RE | Admit: 2022-08-02 | Discharge: 2022-08-02 | Disposition: A | Discharge status: Home / Self Care | Payer: Commercial Managed Care - PPO | Source: Ambulatory Visit | Attending: Internal Medicine | Admitting: Internal Medicine

## 2022-08-02 DIAGNOSIS — Z1231 Encounter for screening mammogram for malignant neoplasm of breast: Secondary | ICD-10-CM | POA: Diagnosis not present

## 2022-08-04 ENCOUNTER — Other Ambulatory Visit (HOSPITAL_COMMUNITY): Payer: Self-pay | Admitting: Internal Medicine

## 2022-08-04 DIAGNOSIS — R928 Other abnormal and inconclusive findings on diagnostic imaging of breast: Secondary | ICD-10-CM

## 2022-08-05 ENCOUNTER — Ambulatory Visit (HOSPITAL_COMMUNITY)
Admission: RE | Admit: 2022-08-05 | Discharge: 2022-08-05 | Disposition: A | Discharge status: Home / Self Care | Payer: Commercial Managed Care - PPO | Source: Ambulatory Visit | Attending: Internal Medicine | Admitting: Internal Medicine

## 2022-08-05 DIAGNOSIS — R928 Other abnormal and inconclusive findings on diagnostic imaging of breast: Secondary | ICD-10-CM | POA: Insufficient documentation

## 2022-08-19 ENCOUNTER — Ambulatory Visit: Payer: Commercial Managed Care - PPO | Admitting: Obstetrics & Gynecology

## 2022-10-04 ENCOUNTER — Ambulatory Visit (INDEPENDENT_AMBULATORY_CARE_PROVIDER_SITE_OTHER): Payer: Commercial Managed Care - PPO | Admitting: Obstetrics & Gynecology

## 2022-10-04 ENCOUNTER — Other Ambulatory Visit (HOSPITAL_COMMUNITY)
Admission: RE | Admit: 2022-10-04 | Discharge: 2022-10-04 | Disposition: A | Discharge status: Home / Self Care | Payer: Commercial Managed Care - PPO | Source: Ambulatory Visit | Attending: Obstetrics & Gynecology | Admitting: Obstetrics & Gynecology

## 2022-10-04 ENCOUNTER — Encounter: Payer: Self-pay | Admitting: Obstetrics & Gynecology

## 2022-10-04 VITALS — BP 103/60 | HR 94 | Ht 63.0 in | Wt 116.8 lb

## 2022-10-04 DIAGNOSIS — Z01419 Encounter for gynecological examination (general) (routine) without abnormal findings: Secondary | ICD-10-CM | POA: Insufficient documentation

## 2022-10-04 NOTE — Progress Notes (Signed)
   WELL-WOMAN EXAMINATION Patient name: Catherine Jordan MRN [id number]  Date of birth: 1965/07/22 Chief Complaint:   Gynecologic Exam  History of Present Illness:   Catherine Jordan is a 57 y.o. [id number] PM female being seen today for a routine well-woman exam.   Today she notes no acute complaints or concerns.  Denies vaginal bleeding, discharge, itching or irritation.  Denies pelvic or abdominal pain.  2023 ASCUS +HR HPV/neg 16+18 2022 ASCUS + HR HPV/neg 16+18 2018 normal cytology   No LMP recorded. Patient is postmenopausal.  Last pap see above.  Last mammogram: 08/2022. Last colonoscopy: uptodate     10/04/2022    2:07 PM 08/17/2021    2:20 PM 08/12/2020   11:23 AM 11/24/2016   11:41 AM  Depression screen PHQ 2/9  Decreased Interest 0 0 0 0  Down, Depressed, Hopeless 0 0 0 0  PHQ - 2 Score 0 0 0 0  Altered sleeping 0 0 0   Tired, decreased energy 0 2 2   Change in appetite 0 0 2   Feeling bad or failure about yourself  0 0 0   Trouble concentrating 0 0 0   Moving slowly or fidgety/restless 0 0 0   Suicidal thoughts 0 0 0   PHQ-9 Score 0 2 4       Review of Systems:   Pertinent items are noted in HPI Denies any headaches, blurred vision, fatigue, shortness of breath, chest pain, abdominal pain, bowel movements, urination, or intercourse unless otherwise stated above.  Pertinent History Reviewed:  Reviewed past medical,surgical, social and family history.  Reviewed problem list, medications and allergies. Physical Assessment:   Vitals:   10/04/22 1404  BP: 103/60  Pulse: 94  Weight: 116 lb 12.8 oz (53 kg)  Height: 5\' 3"  (1.6 m)  Body mass index is 20.69 kg/m.        Physical Examination:   General appearance - well appearing, and in no distress  Mental status - alert, oriented to person, place, and time  Psych:  She has a normal mood and affect  Skin - warm and dry, normal color, no suspicious lesions noted  Chest - effort normal, all lung fields clear to  auscultation bilaterally  Heart - normal rate and regular rhythm  Neck:  midline trachea, no thyromegaly or nodules  Breasts - breasts appear normal, no suspicious masses, no skin or nipple changes or  axillary nodes  Abdomen - soft, nontender, nondistended, no masses or organomegaly  Pelvic - VULVA: normal appearing vulva with no masses, tenderness or lesions  VAGINA: normal appearing vagina with normal color and discharge, no lesions  CERVIX: normal appearing cervix without discharge or lesions, no CMT  Thin prep pap is done with HR HPV cotesting  UTERUS: uterus is felt to be normal size, shape, consistency and nontender   ADNEXA: No adnexal masses or tenderness noted.  Extremities:  No swelling or varicosities noted  Chaperone:  Dr. Elaina Pattee      Assessment & Plan:  1) Well-Woman Exam -pap collected, reviewed screening guidelines -screening exams up to date   No orders of the defined types were placed in this encounter.   Meds: No orders of the defined types were placed in this encounter.   Follow-up: Return in about 1 year (around 10/04/2023) for Annual.   Myna Hidalgo, DO Attending Obstetrician & Gynecologist, Faculty Practice [facility] for [facility], [facility]

## 2022-10-11 LAB — CYTOLOGY - PAP
Comment: NEGATIVE
Diagnosis: NEGATIVE
High risk HPV: NEGATIVE

## 2023-02-02 ENCOUNTER — Emergency Department (HOSPITAL_COMMUNITY)
Admission: EM | Admit: 2023-02-02 | Discharge: 2023-02-02 | Disposition: A | Discharge status: Home / Self Care | Payer: Commercial Managed Care - PPO | Attending: Emergency Medicine | Admitting: Emergency Medicine

## 2023-02-02 ENCOUNTER — Emergency Department (HOSPITAL_COMMUNITY): Payer: Commercial Managed Care - PPO

## 2023-02-02 ENCOUNTER — Other Ambulatory Visit: Payer: Self-pay

## 2023-02-02 ENCOUNTER — Encounter (HOSPITAL_COMMUNITY): Payer: Self-pay

## 2023-02-02 DIAGNOSIS — N200 Calculus of kidney: Secondary | ICD-10-CM | POA: Diagnosis not present

## 2023-02-02 DIAGNOSIS — R109 Unspecified abdominal pain: Secondary | ICD-10-CM | POA: Diagnosis present

## 2023-02-02 LAB — CBC WITH DIFFERENTIAL/PLATELET
Abs Immature Granulocytes: 0.03 10*3/uL (ref 0.00–0.07)
Basophils Absolute: 0.1 10*3/uL (ref 0.0–0.1)
Basophils Relative: 1 %
Eosinophils Absolute: 0 10*3/uL (ref 0.0–0.5)
Eosinophils Relative: 0 %
HCT: 43.2 % (ref 36.0–46.0)
Hemoglobin: 14.7 g/dL (ref 12.0–15.0)
Immature Granulocytes: 0 %
Lymphocytes Relative: 37 %
Lymphs Abs: 3.9 10*3/uL (ref 0.7–4.0)
MCH: 31.3 pg (ref 26.0–34.0)
MCHC: 34 g/dL (ref 30.0–36.0)
MCV: 92.1 fL (ref 80.0–100.0)
Monocytes Absolute: 0.9 10*3/uL (ref 0.1–1.0)
Monocytes Relative: 8 %
Neutro Abs: 5.7 10*3/uL (ref 1.7–7.7)
Neutrophils Relative %: 54 %
Platelets: 444 10*3/uL — ABNORMAL HIGH (ref 150–400)
RBC: 4.69 MIL/uL (ref 3.87–5.11)
RDW: 13.4 % (ref 11.5–15.5)
WBC: 10.6 10*3/uL — ABNORMAL HIGH (ref 4.0–10.5)
nRBC: 0 % (ref 0.0–0.2)

## 2023-02-02 LAB — COMPREHENSIVE METABOLIC PANEL
ALT: 18 U/L (ref 0–44)
AST: 19 U/L (ref 15–41)
Albumin: 4.1 g/dL (ref 3.5–5.0)
Alkaline Phosphatase: 88 U/L (ref 38–126)
Anion gap: 10 (ref 5–15)
BUN: 15 mg/dL (ref 6–20)
CO2: 23 mmol/L (ref 22–32)
Calcium: 9.2 mg/dL (ref 8.9–10.3)
Chloride: 106 mmol/L (ref 98–111)
Creatinine, Ser: 0.81 mg/dL (ref 0.44–1.00)
GFR, Estimated: >60 mL/min (ref >60)
Glucose, Bld: 124 mg/dL — ABNORMAL HIGH (ref 70–99)
Potassium: 4.3 mmol/L (ref 3.5–5.1)
Sodium: 139 mmol/L (ref 135–145)
Total Bilirubin: 0.6 mg/dL (ref 0.3–1.2)
Total Protein: 7.6 g/dL (ref 6.5–8.1)

## 2023-02-02 LAB — URINALYSIS, ROUTINE W REFLEX MICROSCOPIC
Bilirubin Urine: NEGATIVE
Glucose, UA: NEGATIVE mg/dL
Ketones, ur: NEGATIVE mg/dL
Leukocytes,Ua: NEGATIVE
Nitrite: NEGATIVE
Protein, ur: NEGATIVE mg/dL
Specific Gravity, Urine: 1.017 (ref 1.005–1.030)
pH: 5 (ref 5.0–8.0)

## 2023-02-02 LAB — LIPASE, BLOOD: Lipase: 37 U/L (ref 11–51)

## 2023-02-02 MED ORDER — ONDANSETRON HCL 4 MG/2ML IJ SOLN
4.0000 mg | Freq: Once | INTRAMUSCULAR | Status: AC
  Administered 2023-02-02: 4 mg via INTRAVENOUS
  Filled 2023-02-02: qty 2

## 2023-02-02 MED ORDER — OXYCODONE-ACETAMINOPHEN 5-325 MG PO TABS: 1.0000 | ORAL_TABLET | Freq: Four times a day (QID) | ORAL | 0 refills | Status: DC | PRN

## 2023-02-02 MED ORDER — ONDANSETRON 4 MG PO TBDP: 4.0000 mg | ORAL_TABLET | Freq: Three times a day (TID) | ORAL | 0 refills | Status: DC | PRN

## 2023-02-02 MED ORDER — KETOROLAC TROMETHAMINE 15 MG/ML IJ SOLN
15.0000 mg | Freq: Once | INTRAMUSCULAR | Status: AC
  Administered 2023-02-02: 15 mg via INTRAVENOUS
  Filled 2023-02-02: qty 1

## 2023-02-02 MED ORDER — OXYCODONE-ACETAMINOPHEN 5-325 MG PO TABS
1.0000 | ORAL_TABLET | Freq: Once | ORAL | Status: AC
  Administered 2023-02-02: 1 via ORAL
  Filled 2023-02-02: qty 1

## 2023-02-02 MED ORDER — HYDROMORPHONE HCL 1 MG/ML IJ SOLN
1.0000 mg | Freq: Once | INTRAMUSCULAR | Status: AC
  Administered 2023-02-02: 1 mg via INTRAVENOUS
  Filled 2023-02-02: qty 1

## 2023-02-02 NOTE — ED Triage Notes (Signed)
Arrives POV with family from work with sudden RLQ/ R flank pain manifesting ~1 hour prior to arrival.   Says she has a history of kidney stones.   Nausea and vomiting during triage.

## 2023-02-02 NOTE — ED Provider Notes (Signed)
[place] [facility] AT [facility] Provider Note   CSN: [id number] Arrival date & time: 02/02/23  7829     History  Chief Complaint  Patient presents with   Flank Pain    Catherine Jordan is a 57 y.o. female.   Flank Pain  Patient presents with acute pain in the right flank.  Came from work.  History of kidney stones and history of ovarian cysts.  No fevers.  No dysuria.  Pain somewhat better after initial pain medicines.  Some nausea.    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 Authorizing Provider  ondansetron (ZOFRAN-ODT) 4 MG disintegrating tablet Take 1 tablet (4 mg total) by mouth every 8 (eight) hours as needed for nausea or vomiting. 02/02/23  Yes Benjiman Core, MD  oxyCODONE-acetaminophen (PERCOCET/ROXICET) 5-325 MG tablet Take 1-2 tablets by mouth every 6 (six) hours as needed for severe pain. 02/02/23  Yes Benjiman Core, MD      Allergies    Patient has no known allergies.    Review of Systems   Review of Systems  Genitourinary:  Positive for flank pain.    Physical Exam Updated Vital Signs BP 100/70   Pulse 79   Temp 98.1 F (36.7 C) (Oral)   Resp 17   Ht 5\' 3"  (1.6 m)   Wt 52.6 kg   SpO2 98%   BMI 20.55 kg/m  Physical Exam Vitals and nursing note reviewed.  Cardiovascular:     Rate and Rhythm: Normal rate.  Abdominal:     Tenderness: There is abdominal tenderness.     Comments: Right lower quadrant tenderness or rebound or guarding.  No hernias palpated.  No definite CVA tenderness.  Musculoskeletal:        General: No tenderness.  Neurological:     Mental Status: She is alert.     ED Results / Procedures / Treatments   Labs (all labs ordered are  listed, but only abnormal results are displayed) Labs Reviewed  CBC WITH DIFFERENTIAL/PLATELET - Abnormal; Notable for the following components:      Result Value   WBC 10.6 (*)    Platelets 444 (*)    All other components within normal limits  COMPREHENSIVE METABOLIC PANEL - Abnormal; Notable for the following components:   Glucose, Bld 124 (*)    All other components within normal limits  URINALYSIS, ROUTINE W REFLEX MICROSCOPIC - Abnormal; Notable for the following components:   Hgb urine dipstick SMALL (*)    Bacteria, UA RARE (*)    All other components within normal limits  LIPASE, BLOOD    EKG None  Radiology CT Renal Stone Study  Result Date: 02/02/2023 CLINICAL DATA:  Right-sided flank pain.  Nephrolithiasis. EXAM: CT ABDOMEN AND PELVIS WITHOUT CONTRAST TECHNIQUE: Multidetector CT imaging of the abdomen and pelvis was performed following the standard protocol without IV contrast. RADIATION DOSE REDUCTION: This exam was performed according to the departmental dose-optimization program which includes automated exposure control, adjustment of the mA and/or kV according to patient size and/or use of iterative reconstruction technique. COMPARISON:  02/04/2008 FINDINGS: Lower chest: No acute findings. Hepatobiliary: No mass visualized on this unenhanced exam. Gallbladder  is unremarkable. No evidence of biliary ductal dilatation. Pancreas: No mass or inflammatory process visualized on this unenhanced exam. Spleen:  Within normal limits in size. Adrenals/Urinary tract: A few tiny less than 5 mm renal calculi are seen bilaterally. Mild right hydroureteronephrosis is seen, due to 2 tiny punctate calculi at the right UVJ. Stomach/Bowel: No evidence of obstruction, inflammatory process, or abnormal fluid collections. Normal appendix visualized. Vascular/Lymphatic: No pathologically enlarged lymph nodes identified. No evidence of abdominal aortic aneurysm. Reproductive:  No mass or other  significant abnormality. Other:  None. Musculoskeletal:  No suspicious bone lesions identified. IMPRESSION: Mild right hydroureteronephrosis due to 2 punctate calculi at the right UVJ. Bilateral nephrolithiasis. Electronically Signed   By: Danae Orleans M.D.   On: 02/02/2023 12:18   DG Abdomen 1 View  Result Date: 02/02/2023 CLINICAL DATA:  Right flank pain, nausea, and vomiting. History of kidney stones. EXAM: ABDOMEN - 1 VIEW COMPARISON:  Renal ultrasound 02/02/2023. CT abdomen and pelvis 02/04/2008. FINDINGS: A 3 mm calcification projecting over the upper pole of the right kidney likely reflects a renal calculus. There may be an additional adjacent faint small calculus as well. No definite left renal calculi are identified, however assessment is limited by overlying stomach and bowel. Multiple small calcifications in the pelvis are compatible with phleboliths, although it would be difficult to exclude a distal ureteral calculus. No dilated loops of bowel are seen to suggest obstruction. No acute osseous abnormality is seen. IMPRESSION: Right nephrolithiasis. Electronically Signed   By: Sebastian Ache M.D.   On: 02/02/2023 08:50   US Renal  Result Date: 02/02/2023 CLINICAL DATA:  Right flank pain. EXAM: RENAL / URINARY TRACT ULTRASOUND COMPLETE COMPARISON:  CT abdomen and pelvis 02/04/2008 FINDINGS: Right Kidney: Renal measurements: 11.1 x 5.4 x 5.4 cm = volume: 169 mL. Echogenicity within normal limits. Mild caliectasis. No mass. 10 mm echogenic focus in the upper pole, possibly a calculus. Left Kidney: Renal measurements: 10.9 x 5.2 x 4.5 cm = volume: 133 mL. Echogenicity within normal limits. No mass or hydronephrosis. 5 mm echogenic focus in the lower pole, possibly a calculus. Bladder: Nondistended. Other: None. IMPRESSION: 1. Mild right renal caliectasis. 2. Possible bilateral renal calculi. Electronically Signed   By: Sebastian Ache M.D.   On: 02/02/2023 08:47    Procedures Procedures     Medications Ordered in ED Medications  ondansetron (ZOFRAN) injection 4 mg (4 mg Intravenous Given 02/02/23 0653)  HYDROmorphone (DILAUDID) injection 1 mg (1 mg Intravenous Given 02/02/23 0654)  ketorolac (TORADOL) 15 MG/ML injection 15 mg (15 mg Intravenous Given 02/02/23 0728)  oxyCODONE-acetaminophen (PERCOCET/ROXICET) 5-325 MG per tablet 1 tablet (1 tablet Oral Given 02/02/23 0623)    ED Course/ Medical Decision Making/ A&P                                 Medical Decision Making Amount and/or Complexity of Data Reviewed Labs: ordered. Radiology: ordered.  Risk Prescription drug management.   Patient with right-sided abdominal flank pain.  Colicky pain.  Started acutely.  Differential diagnosis is probably most likely kidney stone versus potentially ovarian cyst or torsion.  Urine does show trace hemoglobin.  Will get KUB and ultrasound to further evaluate.  Will treat symptomatically.  KUB and ultrasound are consistent with possible kidney stones.  However urine does not show infection.  Feeling somewhat better.  Initial CT scan had been down but now able to get CT  scan.  Discussed with patient and will get CT scan to evaluate.  It was done and does show distal stones along with some renal stones.       Final Clinical Impression(s) / ED Diagnoses Final diagnoses:  Kidney stone    Rx / DC Orders ED Discharge Orders          Ordered    ondansetron (ZOFRAN-ODT) 4 MG disintegrating tablet  Every 8 hours PRN        02/02/23 1231    oxyCODONE-acetaminophen (PERCOCET/ROXICET) 5-325 MG tablet  Every 6 hours PRN        02/02/23 1231              Benjiman Core, MD 02/02/23 1519

## 2023-09-05 ENCOUNTER — Other Ambulatory Visit (HOSPITAL_COMMUNITY): Payer: Self-pay | Admitting: Internal Medicine

## 2023-09-05 DIAGNOSIS — Z1231 Encounter for screening mammogram for malignant neoplasm of breast: Secondary | ICD-10-CM

## 2023-09-12 ENCOUNTER — Encounter (HOSPITAL_COMMUNITY): Payer: Self-pay

## 2023-09-12 ENCOUNTER — Ambulatory Visit (HOSPITAL_COMMUNITY)
Admission: RE | Admit: 2023-09-12 | Discharge: 2023-09-12 | Disposition: A | Discharge status: Home / Self Care | Source: Ambulatory Visit | Attending: Internal Medicine | Admitting: Internal Medicine

## 2023-09-12 DIAGNOSIS — Z1231 Encounter for screening mammogram for malignant neoplasm of breast: Secondary | ICD-10-CM | POA: Insufficient documentation

## 2023-09-14 ENCOUNTER — Other Ambulatory Visit (HOSPITAL_COMMUNITY): Payer: Self-pay | Admitting: Internal Medicine

## 2023-09-14 DIAGNOSIS — R928 Other abnormal and inconclusive findings on diagnostic imaging of breast: Secondary | ICD-10-CM

## 2023-09-27 ENCOUNTER — Ambulatory Visit (HOSPITAL_COMMUNITY)
Admission: RE | Admit: 2023-09-27 | Discharge: 2023-09-27 | Disposition: A | Discharge status: Home / Self Care | Source: Ambulatory Visit | Attending: Internal Medicine | Admitting: Internal Medicine

## 2023-09-27 ENCOUNTER — Other Ambulatory Visit (HOSPITAL_COMMUNITY): Payer: Self-pay | Admitting: Internal Medicine

## 2023-09-27 DIAGNOSIS — R928 Other abnormal and inconclusive findings on diagnostic imaging of breast: Secondary | ICD-10-CM

## 2023-10-03 ENCOUNTER — Other Ambulatory Visit (HOSPITAL_COMMUNITY): Payer: Self-pay | Admitting: Internal Medicine

## 2023-10-03 DIAGNOSIS — R928 Other abnormal and inconclusive findings on diagnostic imaging of breast: Secondary | ICD-10-CM

## 2023-10-04 ENCOUNTER — Other Ambulatory Visit (HOSPITAL_COMMUNITY): Payer: Self-pay | Admitting: Internal Medicine

## 2023-10-04 ENCOUNTER — Ambulatory Visit (HOSPITAL_COMMUNITY)
Admission: RE | Admit: 2023-10-04 | Discharge: 2023-10-04 | Disposition: A | Discharge status: Home / Self Care | Source: Ambulatory Visit | Attending: Internal Medicine | Admitting: Internal Medicine

## 2023-10-04 ENCOUNTER — Encounter (HOSPITAL_COMMUNITY): Payer: Self-pay

## 2023-10-04 DIAGNOSIS — N6001 Solitary cyst of right breast: Secondary | ICD-10-CM | POA: Diagnosis not present

## 2023-10-04 DIAGNOSIS — R928 Other abnormal and inconclusive findings on diagnostic imaging of breast: Secondary | ICD-10-CM

## 2023-10-04 MED ORDER — LIDOCAINE HCL (PF) 2 % IJ SOLN
10.0000 mL | Freq: Once | INTRAMUSCULAR | Status: AC
  Administered 2023-10-04: 10 mL

## 2023-10-04 MED ORDER — LIDOCAINE-EPINEPHRINE (PF) 1 %-1:200000 IJ SOLN
10.0000 mL | Freq: Once | INTRAMUSCULAR | Status: AC
  Administered 2023-10-04: 10 mL via INTRADERMAL

## 2023-10-06 ENCOUNTER — Encounter: Payer: Self-pay | Admitting: Obstetrics & Gynecology

## 2023-10-06 ENCOUNTER — Ambulatory Visit: Admitting: Obstetrics & Gynecology

## 2023-10-06 ENCOUNTER — Other Ambulatory Visit (HOSPITAL_COMMUNITY)
Admission: RE | Admit: 2023-10-06 | Discharge: 2023-10-06 | Disposition: A | Discharge status: Home / Self Care | Source: Ambulatory Visit | Attending: Obstetrics & Gynecology | Admitting: Obstetrics & Gynecology

## 2023-10-06 VITALS — BP 107/62 | Ht 63.0 in | Wt 122.8 lb

## 2023-10-06 DIAGNOSIS — Z01419 Encounter for gynecological examination (general) (routine) without abnormal findings: Secondary | ICD-10-CM | POA: Insufficient documentation

## 2023-10-06 NOTE — Progress Notes (Signed)
 WELL-WOMAN EXAMINATION Patient name: Catherine Jordan MRN [id number]  Date of birth: [birth year] Chief Complaint:   Gynecologic Exam  History of Present Illness:   Catherine Jordan is a 58 y.o. [id number] PM female being seen today for a routine well-woman exam.   Denies vaginal bleeding, discharge, itching, irritation.  Denies pelvic or abdominal pain.  Denies urinary or sexual concerns.  Overall doing well and reports no acute GYN concerns  Prior pap 08/2021: ASCUS, HPV+> Colposcopy > Pap 2024 negative  No LMP recorded. Patient is postmenopausal.  The current method of family planning is post menopausal status.    Last pap 08/2021- ASCUS, HPV+, repeat pap 2024 negative  Last mammogram: up todate- completed 09/2023 Last colonoscopy: 09/2023     10/06/2023    9:45 AM 10/04/2022    2:07 PM 08/17/2021    2:20 PM 08/12/2020   11:23 AM 11/24/2016   11:41 AM  Depression screen PHQ 2/9  Decreased Interest 0 0 0 0 0  Down, Depressed, Hopeless 0 0 0 0 0  PHQ - 2 Score 0 0 0 0 0  Altered sleeping 0 0 0 0   Tired, decreased energy 0 0 2 2   Change in appetite 0 0 0 2   Feeling bad or failure about yourself  0 0 0 0   Trouble concentrating 0 0 0 0   Moving slowly or fidgety/restless 0 0 0 0   Suicidal thoughts 0 0 0 0   PHQ-9 Score 0 0 2 4       Review of Systems:   Pertinent items are noted in HPI Denies any headaches, blurred vision, fatigue, shortness of breath, chest pain, abdominal pain, bowel movements, urination, or intercourse unless otherwise stated above.  Pertinent History Reviewed:  Reviewed past medical,surgical, social and family history.  Reviewed problem list, medications and allergies. Physical Assessment:   Vitals:   10/06/23 0943  BP: 107/62  Weight: 122 lb 12.8 oz (55.7 kg)  Height: 5\' 3"  (1.6 m)  Body mass index is 21.75 kg/m.        Physical Examination:   General appearance - well appearing, and in no distress  Mental status - alert, oriented to person, place,  and time  Psych:  She has a normal mood and affect  Skin - warm and dry, normal color, no suspicious lesions noted  Chest - effort normal, all lung fields clear to auscultation bilaterally  Heart - normal rate and regular rhythm  Neck:  midline trachea, no thyromegaly or nodules  Breasts - breasts appear normal, no suspicious masses, no skin or nipple changes or  axillary nodes  Abdomen - soft, nontender, nondistended, no masses or organomegaly  Pelvic - VULVA: normal appearing vulva with no masses, tenderness or lesions  VAGINA: normal appearing vagina with normal color and discharge, no lesions  CERVIX: normal appearing cervix without discharge or lesions, no CMT  Thin prep pap is done with HR HPV cotesting  UTERUS: uterus is felt to be normal size, shape, consistency and nontender   ADNEXA: No adnexal masses or tenderness noted.  Extremities:  No swelling or varicosities noted  Chaperone: Lorean Rodes     Assessment & Plan:  1) Well-Woman Exam -pap collected today, due to h/o ASCUS, HPV+ -further management pending results -other preventive screening up to date  No orders of the defined types were placed in this encounter.   Meds: No orders of the defined types were placed in this encounter.  Follow-up: Return in about 1 year (around 10/05/2024) for Annual.   Lynnetta Tom, DO Attending Obstetrician & Gynecologist, [facility], [facility]

## 2023-10-12 LAB — CYTOLOGY - PAP
Comment: NEGATIVE
Comment: NEGATIVE
Comment: NEGATIVE
Diagnosis: UNDETERMINED — AB
HPV 16: NEGATIVE
HPV 18 / 45: NEGATIVE
High risk HPV: POSITIVE — AB

## 2023-10-13 ENCOUNTER — Ambulatory Visit: Payer: Self-pay | Admitting: Obstetrics & Gynecology

## 2024-03-13 ENCOUNTER — Other Ambulatory Visit (HOSPITAL_COMMUNITY): Payer: Self-pay | Admitting: Internal Medicine

## 2024-03-13 DIAGNOSIS — F17210 Nicotine dependence, cigarettes, uncomplicated: Secondary | ICD-10-CM

## 2024-03-28 ENCOUNTER — Encounter (HOSPITAL_COMMUNITY): Payer: Self-pay

## 2024-03-28 ENCOUNTER — Ambulatory Visit (HOSPITAL_COMMUNITY): Admission: RE | Admit: 2024-03-28 | Source: Ambulatory Visit
